# Patient Record
Sex: Male | Born: 1995 | Race: Black or African American | Hispanic: No | Marital: Single | Smoking: Current every day smoker
Health system: Southern US, Community
[De-identification: ages and names within clinical notes are randomized; demographics above are authoritative.]

---
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
## 2014-03-20 ENCOUNTER — Encounter (HOSPITAL_COMMUNITY): Payer: Self-pay | Admitting: Emergency Medicine

## 2014-03-20 ENCOUNTER — Emergency Department (HOSPITAL_COMMUNITY)
Admission: EM | Admit: 2014-03-20 | Discharge: 2014-03-20 | Disposition: A | Discharge status: Home / Self Care | Payer: BC Managed Care – PPO | Attending: Emergency Medicine | Admitting: Emergency Medicine

## 2014-03-20 DIAGNOSIS — Z791 Long term (current) use of non-steroidal anti-inflammatories (NSAID): Secondary | ICD-10-CM | POA: Diagnosis not present

## 2014-03-20 DIAGNOSIS — Z711 Person with feared health complaint in whom no diagnosis is made: Secondary | ICD-10-CM

## 2014-03-20 DIAGNOSIS — R300 Dysuria: Secondary | ICD-10-CM | POA: Diagnosis present

## 2014-03-20 DIAGNOSIS — R369 Urethral discharge, unspecified: Secondary | ICD-10-CM | POA: Diagnosis not present

## 2014-03-20 DIAGNOSIS — Z792 Long term (current) use of antibiotics: Secondary | ICD-10-CM | POA: Insufficient documentation

## 2014-03-20 DIAGNOSIS — Z202 Contact with and (suspected) exposure to infections with a predominantly sexual mode of transmission: Secondary | ICD-10-CM | POA: Insufficient documentation

## 2014-03-20 DIAGNOSIS — Z720 Tobacco use: Secondary | ICD-10-CM | POA: Insufficient documentation

## 2014-03-20 LAB — URINALYSIS, ROUTINE W REFLEX MICROSCOPIC
Bilirubin Urine: NEGATIVE
GLUCOSE, UA: NEGATIVE mg/dL
Hgb urine dipstick: NEGATIVE
Ketones, ur: 15 mg/dL — AB
NITRITE: NEGATIVE
PROTEIN: 30 mg/dL — AB
Specific Gravity, Urine: 1.031 — ABNORMAL HIGH (ref 1.005–1.030)
Urobilinogen, UA: 2 mg/dL — ABNORMAL HIGH (ref 0.0–1.0)
pH: 7.5 (ref 5.0–8.0)

## 2014-03-20 LAB — URINE MICROSCOPIC-ADD ON

## 2014-03-20 LAB — HIV ANTIBODY (ROUTINE TESTING W REFLEX): HIV: NONREACTIVE

## 2014-03-20 LAB — RPR

## 2014-03-20 MED ORDER — LIDOCAINE HCL (PF) 1 % IJ SOLN
5.0000 mL | Freq: Once | INTRAMUSCULAR | Status: AC
  Administered 2014-03-20: 2.1 mL
  Filled 2014-03-20: qty 5

## 2014-03-20 MED ORDER — AZITHROMYCIN 250 MG PO TABS
1000.0000 mg | ORAL_TABLET | Freq: Once | ORAL | Status: AC
  Administered 2014-03-20: 1000 mg via ORAL
  Filled 2014-03-20: qty 4

## 2014-03-20 MED ORDER — METRONIDAZOLE 500 MG PO TABS
2000.0000 mg | ORAL_TABLET | Freq: Once | ORAL | Status: AC
  Administered 2014-03-20: 2000 mg via ORAL
  Filled 2014-03-20: qty 4

## 2014-03-20 MED ORDER — CEFTRIAXONE SODIUM 250 MG IJ SOLR
250.0000 mg | Freq: Once | INTRAMUSCULAR | Status: AC
  Administered 2014-03-20: 250 mg via INTRAMUSCULAR
  Filled 2014-03-20: qty 250

## 2014-03-20 NOTE — ED Provider Notes (Signed)
CSN: [id number]     Arrival date & time 03/20/14  1614 History  This chart was scribed for non-physician practitioner, Allen Derry, PA-C working with Warnell Forester, MD by Luisa Dago, ED scribe. This patient was seen in room TR10C/TR10C and the patient's care was started at 5:08 PM.    Chief Complaint  Patient presents with  . Penile Discharge  . Dysuria   Patient is a 18 y.o. male presenting with penile discharge and dysuria. The history is provided by the patient. No language interpreter was used.  Penile Discharge This is a new problem. The current episode started more than 2 days ago. The problem occurs constantly. The problem has been gradually worsening. Pertinent negatives include no chest pain, no abdominal pain and no shortness of breath. Exacerbated by: erection and urination. Nothing relieves the symptoms. He has tried nothing for the symptoms.  Dysuria Pertinent negatives include no chest pain, no abdominal pain and no shortness of breath.   HPI Comments: Donald Hamilton is a 18 y.o. Healthy male who presents to the Emergency Department complaining of thick whiteish penile discharge that started 1 week ago. Pt is also complaining of associated dysuria. He describes the discomfort as a burning sensation in his penis, and states it's only present when urinating. He rates his current pain as a "8/10" and exacerbated by erections and urination. No known alleviating factors aside from not urinating. Has not ejaculated since the pain started therefore he's unsure if this exacerbates his pain. In the last year pt has had 4 sexual partners, protected with all except for one last Saturday, vaginal penetration only. He does endorse a hx of chlamydia. Denies any CP, SOB, visual disturbances, eye redness/pain, hematuria, fever, chills, erythema of his penis, testicular or scrotal pain/swelling, rectal pain, genital lesions, nausea, emesis, constipation, abdominal pain, arthralgias, myalgias,  or rash.   History reviewed. No pertinent past medical history. History reviewed. No pertinent past surgical history. History reviewed. No pertinent family history. History  Substance Use Topics  . Smoking status: Current Every Day Smoker  . Smokeless tobacco: Not on file  . Alcohol Use: No    Review of Systems  Constitutional: Negative for fever and chills.  Eyes: Negative for pain and redness.  Respiratory: Negative for shortness of breath.   Cardiovascular: Negative for chest pain.  Gastrointestinal: Negative for nausea, vomiting, abdominal pain, diarrhea, constipation and rectal pain.  Genitourinary: Positive for dysuria and discharge. Negative for urgency, frequency, hematuria, flank pain, penile swelling, scrotal swelling, difficulty urinating, genital sores, penile pain and testicular pain.  Musculoskeletal: Negative for myalgias and arthralgias.  Skin: Negative for rash.  Allergic/Immunologic: Negative for immunocompromised state.  Neurological: Negative for weakness.  10 Systems reviewed and all are negative for acute change except as noted in the HPI.      Allergies  Review of patient's allergies indicates no known allergies.  Home Medications   Prior to Admission medications   Medication Sig Start Date End Date Taking? Authorizing Provider  doxycycline (VIBRAMYCIN) 100 MG capsule Take 1 capsule (100 mg total) by mouth 2 (two) times daily. One po bid x 7 days 11/09/13   Fayrene Helper, PA-C  ibuprofen (ADVIL,MOTRIN) 800 MG tablet Take 1 tablet (800 mg total) by mouth 3 (three) times daily. 11/09/13   Fayrene Helper, PA-C   BP 128/69 mmHg  Pulse 111  Temp(Src) 98.5 F (36.9 C) (Oral)  Resp 18  Ht 6\' 2"  (1.88 m)  Wt 180 lb (81.647 kg)  BMI  23.10 kg/m2  SpO2 100%  Physical Exam  Constitutional: He is oriented to person, place, and time. Vital signs are normal. He appears well-developed and well-nourished.  Non-toxic appearance. No distress.  Afebrile, nontoxic  HENT:   Head: Normocephalic and atraumatic.  Eyes: Conjunctivae and EOM are normal. Right eye exhibits no discharge. Left eye exhibits no discharge.  Neck: Normal range of motion. Neck supple.  Cardiovascular: Normal rate.   Pulmonary/Chest: Effort normal. No respiratory distress.  Abdominal: Soft. Normal appearance. He exhibits no distension. There is no tenderness. There is no rigidity, no rebound, no guarding and no CVA tenderness. Hernia confirmed negative in the right inguinal area and confirmed negative in the left inguinal area.  Soft, NTND, no r/g/r, no CVA TTP   Genitourinary: Testes normal. Cremasteric reflex is present. Right testis shows no mass, no swelling and no tenderness. Left testis shows no mass, no swelling and no tenderness. Circumcised. No phimosis, paraphimosis, hypospadias, penile erythema or penile tenderness. Discharge found.  Circumcised penis without phimosis/paraphimosis, hypospadias, erythema, or tenderness. +White creamy discharge. Testes with no masses or tenderness, no swelling, and cremasterics reflex present bilaterally. No inguinal hernias or adenopathy present.  Musculoskeletal: Normal range of motion.  Lymphadenopathy:       Right: No inguinal adenopathy present.       Left: No inguinal adenopathy present.  Neurological: He is alert and oriented to person, place, and time. He has normal strength. No sensory deficit. Gait normal.  Skin: Skin is warm, dry and intact. No rash noted.  Psychiatric: He has a normal mood and affect. His behavior is normal.  Nursing note and vitals reviewed.   ED Course  Procedures (including critical care time)  DIAGNOSTIC STUDIES: Oxygen Saturation is 100% on RA, normal by my interpretation.    COORDINATION OF CARE: 5:14 PM- Will order an STD panel. Pt advised of plan for treatment and pt agrees.  Labs Review Labs Reviewed  URINALYSIS, ROUTINE W REFLEX MICROSCOPIC - Abnormal; Notable for the following:    Color, Urine AMBER  (*)    APPearance TURBID (*)    Specific Gravity, Urine 1.031 (*)    Ketones, ur 15 (*)    Protein, ur 30 (*)    Urobilinogen, UA 2.0 (*)    Leukocytes, UA LARGE (*)    All other components within normal limits  GC/CHLAMYDIA PROBE AMP  URINE MICROSCOPIC-ADD ON  RPR  HIV ANTIBODY (ROUTINE TESTING)    Imaging Review No results found.   EKG Interpretation None      MDM   Final diagnoses:  Penile discharge  Dysuria  Concern about STD in male without diagnosis    18 y.o. male with dysuria and penile discharge. U/A consistent with urethritis, and discharge c/w GC/CT. Treated empirically today. HIV/RPR checked. Discussed f/up with [facility] for future STD concerns. Discussed that lab will call him if + tests. Dental pain associated with dental infection and possible dental abscess with patient afebrile, non toxic appearing and swallowing secretions well. I gave patient referral to dentist and stressed the importance of dental follow up for ultimate management of dental pain.  I have also discussed reasons to return immediately to the ER.  Patient expresses understanding and agrees with plan.  I will also give doxycycline and pain control.    I personally performed the services described in this documentation, which was scribed in my presence. The recorded information has been reviewed and is accurate.  BP 128/69 mmHg  Pulse  111  Temp(Src) 98.5 F (36.9 C) (Oral)  Resp 18  Ht 6\' 2"  (1.88 m)  Wt 180 lb (81.647 kg)  BMI 23.10 kg/m2  SpO2 100%  Meds ordered this encounter  Medications  . azithromycin (ZITHROMAX) tablet 1,000 mg    Sig:    And  . cefTRIAXone (ROCEPHIN) injection 250 mg    Sig:     Order Specific Question:  Antibiotic Indication:    Answer:  STD   And  . metroNIDAZOLE (FLAGYL) tablet 2,000 mg    Sig:   . lidocaine (PF) (XYLOCAINE) 1 % injection 5 mL    Sig:      Donnita FallsMercedes Strupp Louisvilleamprubi-Soms, PA-C 03/20/14 1815  Warnell Foresterrey Wofford,  MD 03/24/14 1810

## 2014-03-20 NOTE — ED Notes (Signed)
Pt c/o dysuria and penile discharge; pt sts thinks he has an STD

## 2014-03-20 NOTE — Discharge Instructions (Signed)
[facility] or screenings. This is the recommendation by the CDC for people with multiple sexual partners or hx of STDs. You have been treated for gonorrhea and chlamydia in the ER but the hospital will call you if lab is positive. You were tested for HIV and Syphilis, and the hospital will call you if the lab is positive.    Dysuria Dysuria is the medical term for pain with urination. There are many causes for dysuria, but urinary tract infection is the most common. If a urinalysis was performed it can show that there is a urinary tract infection. A urine culture confirms that you or your child is sick. You will need to follow up with a healthcare provider because:  If a urine culture was done you will need to know the culture results and treatment recommendations.  If the urine culture was positive, you or your child will need to be put on antibiotics or know if the antibiotics prescribed are the right antibiotics for your urinary tract infection.  If the urine culture is negative (no urinary tract infection), then other causes may need to be explored or antibiotics need to be stopped. Today laboratory work may have been done and there does not seem to be an infection. If cultures were done they will take at least 24 to 48 hours to be completed. Today x-rays may have been taken and they read as normal. No cause can be found for the problems. The x-rays may be re-read by a radiologist and you will be contacted if additional findings are made. You or your child may have been put on medications to help with this problem until you can see your primary caregiver. If the problems get better, see your primary caregiver if the problems return. If you were given antibiotics (medications which kill germs), take all of the mediations as directed for the full course of treatment.  If laboratory work was done, you need to find the results.  Leave a telephone number where you can be reached. If this is not possible, make sure you find out how you are to get test results. HOME CARE INSTRUCTIONS   Drink lots of fluids. For adults, drink eight, 8 ounce glasses of clear juice or water a day. For children, replace fluids as suggested by your caregiver.  Empty the bladder often. Avoid holding urine for long periods of time.  After a bowel movement, women should cleanse front to back, using each tissue only once.  Empty your bladder before and after sexual intercourse.  Take all the medicine given to you until it is gone. You may feel better in a few days, but TAKE ALL MEDICINE.  Avoid caffeine, tea, alcohol and carbonated beverages, because they tend to irritate the bladder.  In men, alcohol may irritate the prostate.  Only take over-the-counter or prescription medicines for pain, discomfort, or fever as directed by your caregiver.  If your caregiver has given you a follow-up appointment, it is very important to keep that appointment. Not keeping the appointment could result in a chronic or permanent injury, pain, and disability. If there is any problem keeping the appointment, you must call back to this facility for assistance. SEEK IMMEDIATE MEDICAL CARE IF:   Back pain develops.  A fever develops.  There is nausea (feeling sick to your stomach) or vomiting (throwing up).  Problems are no better with medications or are getting worse. MAKE SURE  Understand these instructions. °· Will watch your condition. °· Will get help right away if you are not doing well or get worse. °Document Released: 12/27/2003 Document Revised: 06/22/2011 Document Reviewed: 11/03/2007 °ExitCare® Patient Information ©2015 [employer]. This information is not intended to replace advice given to you by your health care provider. Make sure you discuss any questions you have with your health care provider. ° °

## 2014-03-21 LAB — GC/CHLAMYDIA PROBE AMP
CT PROBE, AMP APTIMA: NEGATIVE
GC Probe RNA: POSITIVE — AB

## 2014-03-22 ENCOUNTER — Telehealth (HOSPITAL_BASED_OUTPATIENT_CLINIC_OR_DEPARTMENT_OTHER): Payer: Self-pay | Admitting: Emergency Medicine

## 2014-03-23 ENCOUNTER — Telehealth: Payer: Self-pay | Admitting: Emergency Medicine

## 2015-04-16 ENCOUNTER — Emergency Department (HOSPITAL_COMMUNITY)
Admission: EM | Admit: 2015-04-16 | Discharge: 2015-04-16 | Discharge status: Against Medical Advice | Payer: BLUE CROSS/BLUE SHIELD | Attending: Emergency Medicine | Admitting: Emergency Medicine

## 2015-04-16 ENCOUNTER — Emergency Department (HOSPITAL_COMMUNITY): Payer: BLUE CROSS/BLUE SHIELD

## 2015-04-16 DIAGNOSIS — S199XXA Unspecified injury of neck, initial encounter: Secondary | ICD-10-CM | POA: Insufficient documentation

## 2015-04-16 DIAGNOSIS — Y92410 Unspecified street and highway as the place of occurrence of the external cause: Secondary | ICD-10-CM | POA: Insufficient documentation

## 2015-04-16 DIAGNOSIS — Y9389 Activity, other specified: Secondary | ICD-10-CM | POA: Insufficient documentation

## 2015-04-16 DIAGNOSIS — Y998 Other external cause status: Secondary | ICD-10-CM | POA: Insufficient documentation

## 2015-04-16 DIAGNOSIS — F17200 Nicotine dependence, unspecified, uncomplicated: Secondary | ICD-10-CM | POA: Insufficient documentation

## 2015-04-16 DIAGNOSIS — Z791 Long term (current) use of non-steroidal anti-inflammatories (NSAID): Secondary | ICD-10-CM | POA: Insufficient documentation

## 2015-04-16 DIAGNOSIS — S29001A Unspecified injury of muscle and tendon of front wall of thorax, initial encounter: Secondary | ICD-10-CM | POA: Insufficient documentation

## 2015-04-16 NOTE — ED Notes (Signed)
Pt to xray
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
## 2015-04-16 NOTE — ED Notes (Signed)
Bed: WA27 Expected date:  Expected time:  Means of arrival:  Comments: EMS 
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
## 2015-08-08 ENCOUNTER — Emergency Department (HOSPITAL_COMMUNITY)
Admission: EM | Admit: 2015-08-08 | Discharge: 2015-08-08 | Disposition: A | Discharge status: Home / Self Care | Payer: BLUE CROSS/BLUE SHIELD | Attending: Emergency Medicine | Admitting: Emergency Medicine

## 2015-08-08 ENCOUNTER — Encounter (HOSPITAL_COMMUNITY): Payer: Self-pay | Admitting: *Deleted

## 2015-08-08 DIAGNOSIS — Z202 Contact with and (suspected) exposure to infections with a predominantly sexual mode of transmission: Secondary | ICD-10-CM | POA: Diagnosis present

## 2015-08-08 DIAGNOSIS — Z791 Long term (current) use of non-steroidal anti-inflammatories (NSAID): Secondary | ICD-10-CM | POA: Insufficient documentation

## 2015-08-08 DIAGNOSIS — R300 Dysuria: Secondary | ICD-10-CM | POA: Diagnosis not present

## 2015-08-08 DIAGNOSIS — F17200 Nicotine dependence, unspecified, uncomplicated: Secondary | ICD-10-CM | POA: Diagnosis not present

## 2015-08-08 DIAGNOSIS — R369 Urethral discharge, unspecified: Secondary | ICD-10-CM | POA: Insufficient documentation

## 2015-08-08 MED ORDER — AZITHROMYCIN 250 MG PO TABS
1000.0000 mg | ORAL_TABLET | Freq: Once | ORAL | Status: AC
  Administered 2015-08-08: 1000 mg via ORAL
  Filled 2015-08-08: qty 4

## 2015-08-08 MED ORDER — ONDANSETRON 4 MG PO TBDP
4.0000 mg | ORAL_TABLET | Freq: Once | ORAL | Status: AC
  Administered 2015-08-08: 4 mg via ORAL
  Filled 2015-08-08: qty 1

## 2015-08-08 MED ORDER — CEFTRIAXONE SODIUM 250 MG IJ SOLR
250.0000 mg | Freq: Once | INTRAMUSCULAR | Status: AC
  Administered 2015-08-08: 250 mg via INTRAMUSCULAR
  Filled 2015-08-08: qty 250

## 2015-08-08 MED ORDER — METRONIDAZOLE 500 MG PO TABS
2000.0000 mg | ORAL_TABLET | Freq: Once | ORAL | Status: AC
  Administered 2015-08-08: 2000 mg via ORAL
  Filled 2015-08-08: qty 4

## 2015-08-08 MED ORDER — STERILE WATER FOR INJECTION IJ SOLN
10.0000 mL | Freq: Once | INTRAMUSCULAR | Status: AC
  Administered 2015-08-08: 10 mL via INTRAMUSCULAR
  Filled 2015-08-08: qty 10

## 2015-08-08 NOTE — ED Notes (Signed)
Pt reports burning with urination states that he know that he has been exposed to chlamydia.

## 2015-08-08 NOTE — Discharge Instructions (Signed)
Safe Sex °Safe sex is about reducing the risk of giving or getting a sexually transmitted disease (STD). STDs are spread through sexual contact involving the genitals, mouth, or rectum. Some STDs can be cured and others cannot. Safe sex can also prevent unintended pregnancies.  °WHAT ARE SOME SAFE SEX PRACTICES? °· Limit your sexual activity to only one partner who is having sex with only you. °· Talk to your partner about his or her past partners, past STDs, and drug use. °· Use a condom every time you have sexual intercourse. This includes vaginal, oral, and anal sexual activity. Both females and males should wear condoms during oral sex. Only use latex or polyurethane condoms and water-based lubricants. Using petroleum-based lubricants or oils to lubricate a condom will weaken the condom and increase the chance that it will break. The condom should be in place from the beginning to the end of sexual activity. Wearing a condom reduces, but does not completely eliminate, your risk of getting or giving an STD. STDs can be spread by contact with infected body fluids and skin. °· Get vaccinated for hepatitis B and HPV. °· Avoid alcohol and recreational drugs, which can affect your judgment. You may forget to use a condom or participate in high-risk sex. °· For females, avoid douching after sexual intercourse. Douching can spread an infection farther into the reproductive tract. °· Check your body for signs of sores, blisters, rashes, or unusual discharge. See your health care provider if you notice any of these signs. °· Avoid sexual contact if you have symptoms of an infection or are being treated for an STD. If you or your partner has herpes, avoid sexual contact when blisters are present. Use condoms at all other times. °· If you are at risk of being infected with HIV, it is recommended that you take a prescription medicine daily to prevent HIV infection. This is called pre-exposure prophylaxis (PrEP). You are  considered at risk if: °· You are a man who has sex with other men (MSM). °· You are a heterosexual man or woman who is sexually active with more than one partner. °· You take drugs by injection. °· You are sexually active with a partner who has HIV. °· Talk with your health care provider about whether you are at high risk of being infected with HIV. If you choose to begin PrEP, you should first be tested for HIV. You should then be tested every 3 months for as long as you are taking PrEP. °· See your health care provider for regular screenings, exams, and tests for other STDs. Before having sex with a new partner, each of you should be screened for STDs and should talk about the results with each other. °WHAT ARE THE BENEFITS OF SAFE SEX?  °· There is less chance of getting or giving an STD. °· You can prevent unwanted or unintended pregnancies. °· By discussing safe sex concerns with your partner, you may increase feelings of intimacy, comfort, trust, and honesty between the two of you. °  °This information is not intended to replace advice given to you by your health care provider. Make sure you discuss any questions you have with your health care provider. °  °Document Released: 05/07/2004 Document Revised: 04/20/2014 Document Reviewed: 09/21/2011 °Elsevier Interactive Patient Education ©2016 Elsevier Inc. ° °Sexually Transmitted Disease °A sexually transmitted disease (STD) is a disease or infection that may be passed (transmitted) from person to person, usually during sexual activity. This may happen by   way of saliva, semen, blood, vaginal mucus, or urine. Common STDs include: °· Gonorrhea. °· Chlamydia. °· Syphilis. °· HIV and AIDS. °· Genital herpes. °· Hepatitis B and C. °· Trichomonas. °· Human papillomavirus (HPV). °· Pubic lice. °· Scabies. °· Mites. °· Bacterial vaginosis. °WHAT ARE CAUSES OF STDs? °An STD may be caused by bacteria, a virus, or parasites. STDs are often transmitted during sexual  activity if one person is infected. However, they may also be transmitted through nonsexual means. STDs may be transmitted after:  °· Sexual intercourse with an infected person. °· Sharing sex toys with an infected person. °· Sharing needles with an infected person or using unclean piercing or tattoo needles. °· Having intimate contact with the genitals, mouth, or rectal areas of an infected person. °· Exposure to infected fluids during birth. °WHAT ARE THE SIGNS AND SYMPTOMS OF STDs? °Different STDs have different symptoms. Some people may not have any symptoms. If symptoms are present, they may include: °· Painful or bloody urination. °· Pain in the pelvis, abdomen, vagina, anus, throat, or eyes. °· A skin rash, itching, or irritation. °· Growths, ulcerations, blisters, or sores in the genital and anal areas. °· Abnormal vaginal discharge with or without bad odor. °· Penile discharge in men. °· Fever. °· Pain or bleeding during sexual intercourse. °· Swollen glands in the groin area. °· Yellow skin and eyes (jaundice). This is seen with hepatitis. °· Swollen testicles. °· Infertility. °· Sores and blisters in the mouth. °HOW ARE STDs DIAGNOSED? °To make a diagnosis, your health care provider may: °· Take a medical history. °· Perform a physical exam. °· Take a sample of any discharge to examine. °· Swab the throat, cervix, opening to the penis, rectum, or vagina for testing. °· Test a sample of your first morning urine. °· Perform blood tests. °· Perform a Pap test, if this applies. °· Perform a colposcopy. °· Perform a laparoscopy. °HOW ARE STDs TREATED? °Treatment depends on the STD. Some STDs may be treated but not cured. °· Chlamydia, gonorrhea, trichomonas, and syphilis can be cured with antibiotic medicine. °· Genital herpes, hepatitis, and HIV can be treated, but not cured, with prescribed medicines. The medicines lessen symptoms. °· Genital warts from HPV can be treated with medicine or by freezing,  burning (electrocautery), or surgery. Warts may come back. °· HPV cannot be cured with medicine or surgery. However, abnormal areas may be removed from the cervix, vagina, or vulva. °· If your diagnosis is confirmed, your recent sexual partners need treatment. This is true even if they are symptom-free or have a negative culture or evaluation. They should not have sex until their health care providers say it is okay. °· Your health care provider may test you for infection again 3 months after treatment. °HOW CAN I REDUCE MY RISK OF GETTING AN STD? °Take these steps to reduce your risk of getting an STD: °· Use latex condoms, dental dams, and water-soluble lubricants during sexual activity. Do not use petroleum jelly or oils. °· Avoid having multiple sex partners. °· Do not have sex with someone who has other sex partners °· Do not have sex with anyone you do not know or who is at high risk for an STD. °· Avoid risky sex practices that can break your skin. °· Do not have sex if you have open sores on your mouth or skin. °· Avoid drinking too much alcohol or taking illegal drugs. Alcohol and drugs can affect your judgment and put   you in a vulnerable position.  Avoid engaging in oral and anal sex acts.  Get vaccinated for HPV and hepatitis. If you have not received these vaccines in the past, talk to your health care provider about whether one or both might be right for you.  If you are at risk of being infected with HIV, it is recommended that you take a prescription medicine daily to prevent HIV infection. This is called pre-exposure prophylaxis (PrEP). You are considered at risk if:  You are a man who has sex with other men (MSM).  You are a heterosexual man or woman and are sexually active with more than one partner.  You take drugs by injection.  You are sexually active with a partner who has HIV.  Talk with your health care provider about whether you are at high risk of being infected with HIV. If  you choose to begin PrEP, you should first be tested for HIV. You should then be tested every 3 months for as long as you are taking PrEP. WHAT SHOULD I DO IF I THINK I HAVE AN STD?  See your health care provider.  Tell your sexual partner(s). They should be tested and treated for any STDs.  Do not have sex until your health care provider says it is okay. WHEN SHOULD I GET IMMEDIATE MEDICAL CARE? Contact your health care provider right away if:   You have severe abdominal pain.  You are a man and notice swelling or pain in your testicles.  You are a woman and notice swelling or pain in your vagina.   This information is not intended to replace advice given to you by your health care provider. Make sure you discuss any questions you have with your health care provider.   Document Released: 06/20/2002 Document Revised: 04/20/2014 Document Reviewed: 10/18/2012 Elsevier Interactive Patient Education 2016 Elsevier Inc.  Free HIV and STD Testing These locations offer FREE confidential testing for HIV, Chlamydia, Gonorrhea, and Syphilis. Non-Traditional Testing Sites Address Telephone  [facility] [address], [place] [phone] Mondays 5pm - 7pm  [facility] [address], [address] [place] [phone] Wednesdays 2pm-8pm  [facility] [address], [place] [phone] Thursdays 9am-12noon 1pm-4pm  [facility] [address], [place] [phone] Tuesdays Thursdays 9am-12noon 1pm-4pm  [facility] offers free, confidential testing and treatment for HIV, Chlamydia, Gonorrhea, Syphilis, Herpes, Bacterial Vaginosis, Yeast, and Trichomoniasis. Traditional Testing   [facility] [address], [place] [phone]    Monday thru Friday  Call for an appointment  [facility] [address] [place] [phone] Monday thru Friday  Call for anappointment.  If you have any questions about this information please call [phone]. 02/19/2011

## 2015-08-08 NOTE — ED Provider Notes (Signed)
CSN: [id number]     Arrival date & time 08/08/15  1245 History   By signing my name below, I, Donald Hamilton, attest that this documentation has been prepared under the direction and in the presence of Arthor CaptainAbigail Maripat Borba, PA-C Electronically Signed: Charline BillsEssence Hamilton, ED Scribe 08/08/2015 at 2:04 PM.   Chief Complaint  Patient presents with  . Exposure to STD   The history is provided by the patient. No language interpreter was used.   HPI Comments: Donald Hamilton is a 20 y.o. male who presents to the Emergency Department complaining of exposure to STD. Pt reports sexual intercourse with a male partner who tested positive for chlamydia. He reports associated dysuria for the past few days. Pt denies any other symptoms.   History reviewed. No pertinent past medical history. History reviewed. No pertinent past surgical history. No family history on file. Social History  Substance Use Topics  . Smoking status: Current Every Day Smoker  . Smokeless tobacco: None  . Alcohol Use: No    Review of Systems  Constitutional: Negative for fever.  Genitourinary: Positive for dysuria.   Allergies  Review of patient's allergies indicates no known allergies.  Home Medications   Prior to Admission medications   Medication Sig Start Date End Date Taking? Authorizing Provider  doxycycline (VIBRAMYCIN) 100 MG capsule Take 1 capsule (100 mg total) by mouth 2 (two) times daily. One po bid x 7 days 11/09/13   Fayrene HelperBowie Tran, PA-C  ibuprofen (ADVIL,MOTRIN) 800 MG tablet Take 1 tablet (800 mg total) by mouth 3 (three) times daily. 11/09/13   Fayrene HelperBowie Tran, PA-C   BP 139/62 mmHg  Pulse 72  Temp(Src) 98.3 F (36.8 C) (Oral)  Resp 14  Ht 6\' 2"  (1.88 m)  Wt 180 lb (81.647 kg)  BMI 23.10 kg/m2  SpO2 100% Physical Exam  Constitutional: He is oriented to person, place, and time. He appears well-developed and well-nourished. No distress.  HENT:  Head: Normocephalic and atraumatic.  Eyes: Conjunctivae and EOM are  normal.  Neck: Neck supple. No tracheal deviation present.  Cardiovascular: Normal rate.   Pulmonary/Chest: Effort normal. No respiratory distress.  Genitourinary: Circumcised. Discharge (clear; from the meatus) found.  Chaperone present. Normal anatomy.   Musculoskeletal: Normal range of motion.  Neurological: He is alert and oriented to person, place, and time.  Skin: Skin is warm and dry.  Psychiatric: He has a normal mood and affect. His behavior is normal.  Nursing note and vitals reviewed.  ED Course  Procedures (including critical care time) DIAGNOSTIC STUDIES: Oxygen Saturation is 100% on RA, normal by my interpretation.    COORDINATION OF CARE: 2:07 PM-Discussed treatment plan which includes STD screening, Zofran, Rocephin, Zithromax, Flagyl with pt at bedside and pt agreed to plan.   Labs Review Labs Reviewed  RPR  HIV ANTIBODY (ROUTINE TESTING)   Imaging Review No results found. I have personally reviewed and evaluated these images and lab results as part of my medical decision-making.   EKG Interpretation None      MDM   Final diagnoses:  Exposure to chlamydia    Patient treated in the ED for known exposure to chlamydia with clear penile discharge.  No signs of epididymitis.  HIV/RPR sent. Appears safe for discharge  I personally performed the services described in this documentation, which was scribed in my presence. The recorded information has been reviewed and is accurate.        Arthor Captainbigail Thea Holshouser, PA-C 08/08/15 1433  Marily MemosJason Mesner, MD 08/08/15  1651 

## 2015-08-09 LAB — RPR: RPR Ser Ql: NONREACTIVE

## 2015-08-09 LAB — HIV ANTIBODY (ROUTINE TESTING W REFLEX): HIV Screen 4th Generation wRfx: NONREACTIVE

## 2015-12-04 ENCOUNTER — Emergency Department (HOSPITAL_COMMUNITY): Payer: BLUE CROSS/BLUE SHIELD

## 2015-12-04 ENCOUNTER — Encounter (HOSPITAL_COMMUNITY): Payer: Self-pay | Admitting: Neurology

## 2015-12-04 ENCOUNTER — Emergency Department (HOSPITAL_COMMUNITY)
Admission: EM | Admit: 2015-12-04 | Discharge: 2015-12-04 | Disposition: A | Discharge status: Home / Self Care | Payer: BLUE CROSS/BLUE SHIELD | Attending: Emergency Medicine | Admitting: Emergency Medicine

## 2015-12-04 DIAGNOSIS — F17200 Nicotine dependence, unspecified, uncomplicated: Secondary | ICD-10-CM | POA: Diagnosis not present

## 2015-12-04 DIAGNOSIS — M25512 Pain in left shoulder: Secondary | ICD-10-CM | POA: Insufficient documentation

## 2015-12-04 MED ORDER — CYCLOBENZAPRINE HCL 5 MG PO TABS: 5.0000 mg | ORAL_TABLET | Freq: Three times a day (TID) | ORAL | 0 refills | Status: AC | PRN

## 2015-12-04 MED ORDER — IBUPROFEN 400 MG PO TABS: 400.0000 mg | ORAL_TABLET | Freq: Once | ORAL | Status: DC

## 2015-12-04 MED ORDER — CYCLOBENZAPRINE HCL 10 MG PO TABS: 5.0000 mg | ORAL_TABLET | Freq: Once | ORAL | Status: DC

## 2015-12-04 NOTE — ED Triage Notes (Signed)
Pt here c/o left shoulder pain after wrestling with his brother last night. Pt able to extend shoulder, no deformity noted.

## 2015-12-04 NOTE — Discharge Instructions (Signed)
Read the information below.   Your x-rays did not show any acute dislocation or fracture. You were given an arm sling, wear during the day for the next 48 hours. Apply ice for 20 minute increments for the next 24-48hours. Take motrin 400mg  every 6hrs of tylenol 650mg  every 6hrs for pain control. I have prescribed flexeril for muscle pain. This can make you drowsy, do not drive after taking.  Use the prescribed medication as directed.  Please discuss all new medications with your pharmacist.   If symptoms persist follow up with your primary care doctor in 5-7 days.  You may return to the Emergency Department at any time for worsening condition or any new symptoms that concern you. Return to ED if you develop fever, red/hot/swollen joint that you are unable to move, or develop numbness/weakness in your left arm.

## 2015-12-04 NOTE — ED Provider Notes (Signed)
[facility]EM[facility] DEPT Provider Note   CSN: [id number] Arrival date & time: 12/04/15  1041  By signing my name below, I, Placido SouLogan Joldersma, attest that this documentation has been prepared under the direction and in the presence of Arvilla MeresAshley Sagan Maselli, PA-C.  Electronically Signed: Placido SouLogan Joldersma, ED Scribe. 12/04/15. 12:28 PM.   History   Chief Complaint Chief Complaint  Patient presents with  . Shoulder Pain    HPI HPI Comments: Donald Hamilton is a 20 y.o. male who is otherwise healthy presents to the Emergency Department complaining of sudden onset, moderate, diffuse, left shoulder pain onset last night. He states that he was wrestling with his brother and felt a "pop" in the shoulder and a sudden onset of his pain which has mildly worsened. His pain is a 5/10 at rest and worsens to 10/10 with LUE movement. He denies having taken any medications for his symptoms. Pt reports mild left shoulder swelling. He denies any other known medical conditions or any recent surgeries. He denies falls, fever, chills, redness/warmth of joint, weakness, numbness and neck pain.   The history is provided by the patient. No language interpreter was used.    History reviewed. No pertinent past medical history.  There are no active problems to display for this patient.   History reviewed. No pertinent surgical history.   Home Medications    Prior to Admission medications   Medication Sig Start Date End Date Taking? Authorizing Provider  cyclobenzaprine (FLEXERIL) 5 MG tablet Take 1 tablet (5 mg total) by mouth 3 (three) times daily as needed for muscle spasms. 12/04/15   Lona KettleAshley Laurel Tela Kotecki, PA-C  doxycycline (VIBRAMYCIN) 100 MG capsule Take 1 capsule (100 mg total) by mouth 2 (two) times daily. One po bid x 7 days 11/09/13   Fayrene HelperBowie Tran, PA-C  ibuprofen (ADVIL,MOTRIN) 800 MG tablet Take 1 tablet (800 mg total) by mouth 3 (three) times daily. 11/09/13   Fayrene HelperBowie Tran, PA-C    Family History No family history on  file.  Social History Social History  Substance Use Topics  . Smoking status: Current Every Day Smoker  . Smokeless tobacco: Not on file  . Alcohol use No     Allergies   Review of patient's allergies indicates no known allergies.   Review of Systems Review of Systems  Constitutional: Negative for chills and fever.  Musculoskeletal: Positive for arthralgias and joint swelling. Negative for neck pain.  Skin: Negative for color change and wound.  Neurological: Negative for weakness and numbness.   Physical Exam Updated Vital Signs BP 122/59 (BP Location: Right Arm)   Pulse 60   Temp 98.4 F (36.9 C) (Oral)   Resp 16   SpO2 100%   Physical Exam  Constitutional: He is oriented to person, place, and time. He appears well-developed and well-nourished.  HENT:  Head: Normocephalic and atraumatic.  Eyes: EOM are normal.  Neck: Normal range of motion and phonation normal. No spinous process tenderness and no muscular tenderness present. No neck rigidity. Normal range of motion present.  Cardiovascular: Normal rate.   Pulmonary/Chest: Effort normal. No stridor. No respiratory distress.  Abdominal: Soft.  Musculoskeletal: Normal range of motion. He exhibits tenderness.  Pain with ROM of the LUE. Tenderness noted to the [facility] of the left shoulder. No swelling, bruising, warmth, redness or obvious deformities. 2+ radial pulses.   Neurological: He is alert and oriented to person, place, and time.  Distal sensation intact in the LUE. 5/5 strength of the LUE.  Skin: Skin is warm and dry.  Psychiatric: He has a normal mood and affect. His behavior is normal.  Nursing note and vitals reviewed.  ED Treatments / Results  Labs (all labs ordered are listed, but only abnormal results are displayed) Labs Reviewed - No data to display  EKG  EKG Interpretation None       Radiology Dg Shoulder Left  Result Date: 12/04/2015 CLINICAL DATA:  Left shoulder pain. EXAM: LEFT SHOULDER  - 2+ VIEW COMPARISON:  None. FINDINGS: There is no evidence of fracture or dislocation. There is no evidence of arthropathy or other focal bone abnormality. Soft tissues are unremarkable. IMPRESSION: Negative. Electronically Signed   By: Irish LackGlenn  Yamagata M.D.   On: 12/04/2015 12:08    Procedures Procedures  DIAGNOSTIC STUDIES: Oxygen Saturation is 100% on RA, normal by my interpretation.    COORDINATION OF CARE: 12:25 PM Discussed next steps with pt. Pt verbalized understanding and is agreeable with the plan.    Medications Ordered in ED Medications  cyclobenzaprine (FLEXERIL) tablet 5 mg (not administered)  ibuprofen (ADVIL,MOTRIN) tablet 400 mg (not administered)     Initial Impression / Assessment and Plan / ED Course  I have reviewed the triage vital signs and the nursing notes.  Pertinent labs & imaging results that were available during my care of the patient were reviewed by me and considered in my medical decision making (see chart for details).  Clinical Course  Value Comment By Time  DG Shoulder Left No obvious fracture or dislocation Lona Kettleshley Laurel Geonna Lockyer, PA-C 08/23 1229    Patient presents to ED with complaint of shoulder pain. Patient is afebrile and non-toxic appearing in NAD. VSS. Physical exam remarkable for mild TTP of left AC joint. ROM, strength, and sensation intact. 2+ radial pulse. Patient X-Ray negative for obvious fracture or dislocation.  Pt advised to follow up with his PCP if symptoms worsen or do not improve. Conservative therapy recommended and discussed. Patient will be discharged home & is agreeable with above plan. Returns precautions discussed. Pt appears safe for discharge.  I personally performed the services described in this documentation, which was scribed in my presence. The recorded information has been reviewed and is accurate.  Final Clinical Impressions(s) / ED Diagnoses   Final diagnoses:  Left shoulder pain    New Prescriptions New  Prescriptions   CYCLOBENZAPRINE (FLEXERIL) 5 MG TABLET    Take 1 tablet (5 mg total) by mouth 3 (three) times daily as needed for muscle spasms.     Lona Kettleshley Laurel Halley Shepheard, [place] JerseyPA-C 12/04/15 1235    Lyndal Pulleyaniel Knott, MD 12/04/15 [phone]

## 2015-12-04 NOTE — ED Notes (Signed)
Pt is in stable condition upon d/c and ambulates from ED. 

## 2016-01-24 ENCOUNTER — Encounter (HOSPITAL_COMMUNITY): Payer: Self-pay

## 2016-01-24 ENCOUNTER — Emergency Department (HOSPITAL_COMMUNITY)
Admission: EM | Admit: 2016-01-24 | Discharge: 2016-01-24 | Disposition: A | Discharge status: Home / Self Care | Payer: BLUE CROSS/BLUE SHIELD | Attending: Emergency Medicine | Admitting: Emergency Medicine

## 2016-01-24 DIAGNOSIS — F17200 Nicotine dependence, unspecified, uncomplicated: Secondary | ICD-10-CM | POA: Diagnosis not present

## 2016-01-24 DIAGNOSIS — R369 Urethral discharge, unspecified: Secondary | ICD-10-CM | POA: Diagnosis not present

## 2016-01-24 DIAGNOSIS — R300 Dysuria: Secondary | ICD-10-CM | POA: Insufficient documentation

## 2016-01-24 LAB — URINALYSIS, ROUTINE W REFLEX MICROSCOPIC
BILIRUBIN URINE: NEGATIVE
GLUCOSE, UA: NEGATIVE mg/dL
Hgb urine dipstick: NEGATIVE
KETONES UR: NEGATIVE mg/dL
NITRITE: NEGATIVE
PROTEIN: NEGATIVE mg/dL
Specific Gravity, Urine: 1.023 (ref 1.005–1.030)
pH: 7 (ref 5.0–8.0)

## 2016-01-24 LAB — GC/CHLAMYDIA PROBE AMP (~~LOC~~) NOT AT ARMC
Chlamydia: NEGATIVE
Neisseria Gonorrhea: POSITIVE — AB

## 2016-01-24 LAB — URINE MICROSCOPIC-ADD ON
RBC / HPF: NONE SEEN RBC/hpf (ref 0–5)
Squamous Epithelial / LPF: NONE SEEN

## 2016-01-24 LAB — HIV ANTIBODY (ROUTINE TESTING W REFLEX): HIV SCREEN 4TH GENERATION: NONREACTIVE

## 2016-01-24 MED ORDER — CEFTRIAXONE SODIUM 250 MG IJ SOLR
250.0000 mg | Freq: Once | INTRAMUSCULAR | Status: AC
  Administered 2016-01-24: 250 mg via INTRAMUSCULAR
  Filled 2016-01-24: qty 250

## 2016-01-24 MED ORDER — AZITHROMYCIN 250 MG PO TABS
1000.0000 mg | ORAL_TABLET | Freq: Once | ORAL | Status: AC
  Administered 2016-01-24: 1000 mg via ORAL
  Filled 2016-01-24: qty 4

## 2016-01-24 MED ORDER — LIDOCAINE HCL (PF) 1 % IJ SOLN
INTRAMUSCULAR | Status: AC
  Administered 2016-01-24: 5 mL
  Filled 2016-01-24: qty 5

## 2016-01-24 NOTE — ED Provider Notes (Signed)
[facility] Provider Note   CSN: [id number] Arrival date & time: 01/24/16  4098     History   Chief Complaint Chief Complaint  Patient presents with  . SEXUALLY TRANSMITTED DISEASE    HPI Donald Hamilton is a 20 y.o. male who presents with dysuria and discharge. PMH significant for hx of Gonorrhea. He states that he had unprotected sex with a new partner. He has had burning after urination and a whitish discharge from the penis for 1 day. Denies fever, chills, abdominal pain, flank pain, N/V, testicular pain. Reports he normally uses condoms but did not this past time.  HPI  History reviewed. No pertinent past medical history.  There are no active problems to display for this patient.   History reviewed. No pertinent surgical history.     Home Medications    Prior to Admission medications   Medication Sig Start Date End Date Taking? Authorizing Provider  cyclobenzaprine (FLEXERIL) 5 MG tablet Take 1 tablet (5 mg total) by mouth 3 (three) times daily as needed for muscle spasms. 12/04/15   Lona Kettle, PA-C  doxycycline (VIBRAMYCIN) 100 MG capsule Take 1 capsule (100 mg total) by mouth 2 (two) times daily. One po bid x 7 days 11/09/13   Fayrene Helper, PA-C  ibuprofen (ADVIL,MOTRIN) 800 MG tablet Take 1 tablet (800 mg total) by mouth 3 (three) times daily. 11/09/13   Fayrene Helper, PA-C    Family History History reviewed. No pertinent family history.  Social History Social History  Substance Use Topics  . Smoking status: Current Every Day Smoker  . Smokeless tobacco: Not on file  . Alcohol use No     Allergies   Review of patient's allergies indicates no known allergies.   Review of Systems Review of Systems  Constitutional: Negative for chills and fever.  Gastrointestinal: Negative for abdominal pain, nausea and vomiting.  Genitourinary: Positive for discharge and dysuria. Negative for flank pain, hematuria, penile pain, penile swelling, scrotal swelling  and testicular pain.     Physical Exam Updated Vital Signs BP 118/67 (BP Location: Right Arm)   Pulse 60   Temp 98.3 F (36.8 C) (Oral)   Resp 18   Ht 6\' 2"  (1.88 m)   Wt 83.9 kg   SpO2 100%   BMI 23.75 kg/m   Physical Exam  Constitutional: He is oriented to person, place, and time. He appears well-developed and well-nourished. No distress.  Calm, cooperative  HENT:  Head: Normocephalic and atraumatic.  Eyes: Conjunctivae are normal. Pupils are equal, round, and reactive to light. Right eye exhibits no discharge. Left eye exhibits no discharge. No scleral icterus.  Neck: Normal range of motion. Neck supple.  Cardiovascular: Normal rate and regular rhythm.   No murmur heard. Pulmonary/Chest: Effort normal and breath sounds normal. No respiratory distress.  Abdominal: Soft. He exhibits no distension. There is no tenderness.  Genitourinary:  Genitourinary Comments: No inguinal lymphadenopathy or inguinal hernia noted. Normal circumcised penis free of lesions or rash. Testicles are nontender with normal lie. Normal scrotal appearance. No obvious discharge noted. Chaperone present during exam.    Musculoskeletal: He exhibits no edema.  Neurological: He is alert and oriented to person, place, and time.  Skin: Skin is warm and dry.  Psychiatric: He has a normal mood and affect.  Nursing note and vitals reviewed.    ED Treatments / Results  Labs (all labs ordered are listed, but only abnormal results are displayed) Labs Reviewed  URINALYSIS, ROUTINE W REFLEX  MICROSCOPIC (NOT AT [facility]) - Abnormal; Notable for the following:       Result Value   Leukocytes, UA SMALL (*)    All other components within normal limits  URINE MICROSCOPIC-ADD ON - Abnormal; Notable for the following:    Bacteria, UA FEW (*)    All other components within normal limits  HIV ANTIBODY (ROUTINE TESTING)  RPR  GC/CHLAMYDIA PROBE AMP ([place]) NOT AT [facility]    EKG  EKG Interpretation None        Radiology No results found.  Procedures Procedures (including critical care time)  Medications Ordered in ED Medications  cefTRIAXone (ROCEPHIN) injection 250 mg (250 mg Intramuscular Given 01/24/16 0836)  azithromycin (ZITHROMAX) tablet 1,000 mg (1,000 mg Oral Given 01/24/16 0836)  lidocaine (PF) (XYLOCAINE) 1 % injection (5 mLs  Given 01/24/16 0837)     Initial Impression / Assessment and Plan / ED Course  I have reviewed the triage vital signs and the nursing notes.  Pertinent labs & imaging results that were available during my care of the patient were reviewed by me and considered in my medical decision making (see chart for details).  Clinical Course   20 year old male presents with symptoms concerning for STD. Patient is afebrile, not tachycardic or tachypneic, normotensive, and not hypoxic. UA has few bacteria with small leukocytes and 6-30 WBC. Exam is unremarkable - no obvious infection however since he has a hx of Gonorrhea, will treat empirically. Encouraged safe sex practices at all times, especially with new partners. Patient is NAD, non-toxic, with stable VS. Patient is informed of clinical course, understands medical decision making process, and agrees with plan. Opportunity for questions provided and all questions answered. Return precautions given.   Final Clinical Impressions(s) / ED Diagnoses   Final diagnoses:  Dysuria  Penile discharge    New Prescriptions New Prescriptions   No medications on file     Bethel BornKelly Marie Lane Eland, PA-C 01/24/16 1217    Lyndal Pulleyaniel Knott, MD 01/24/16 1820

## 2016-01-24 NOTE — ED Triage Notes (Signed)
Pt here for std, pt sts had std in past and unknown what he had but sts now has discharge and burning after urination.

## 2016-01-25 LAB — RPR: RPR: NONREACTIVE

## 2016-01-27 ENCOUNTER — Telehealth (HOSPITAL_BASED_OUTPATIENT_CLINIC_OR_DEPARTMENT_OTHER): Payer: Self-pay | Admitting: Emergency Medicine

## 2016-06-27 ENCOUNTER — Encounter (HOSPITAL_COMMUNITY): Payer: Self-pay | Admitting: Nurse Practitioner

## 2016-06-27 ENCOUNTER — Emergency Department (HOSPITAL_COMMUNITY)
Admission: EM | Admit: 2016-06-27 | Discharge: 2016-06-27 | Disposition: A | Discharge status: Home / Self Care | Payer: BLUE CROSS/BLUE SHIELD | Attending: Emergency Medicine | Admitting: Emergency Medicine

## 2016-06-27 DIAGNOSIS — F17200 Nicotine dependence, unspecified, uncomplicated: Secondary | ICD-10-CM | POA: Diagnosis not present

## 2016-06-27 DIAGNOSIS — R300 Dysuria: Secondary | ICD-10-CM | POA: Diagnosis not present

## 2016-06-27 LAB — URINALYSIS, ROUTINE W REFLEX MICROSCOPIC
Bacteria, UA: NONE SEEN
Bilirubin Urine: NEGATIVE
Glucose, UA: NEGATIVE mg/dL
HGB URINE DIPSTICK: NEGATIVE
Ketones, ur: NEGATIVE mg/dL
NITRITE: NEGATIVE
PH: 5 (ref 5.0–8.0)
Protein, ur: NEGATIVE mg/dL
SQUAMOUS EPITHELIAL / LPF: NONE SEEN
Specific Gravity, Urine: 1.032 — ABNORMAL HIGH (ref 1.005–1.030)

## 2016-06-27 MED ORDER — STERILE WATER FOR INJECTION IJ SOLN
INTRAMUSCULAR | Status: AC
  Administered 2016-06-27: 10 mL
  Filled 2016-06-27: qty 10

## 2016-06-27 MED ORDER — CEFTRIAXONE SODIUM 250 MG IJ SOLR
250.0000 mg | Freq: Once | INTRAMUSCULAR | Status: AC
  Administered 2016-06-27: 250 mg via INTRAMUSCULAR
  Filled 2016-06-27: qty 250

## 2016-06-27 MED ORDER — AZITHROMYCIN 250 MG PO TABS
1000.0000 mg | ORAL_TABLET | Freq: Once | ORAL | Status: AC
  Administered 2016-06-27: 1000 mg via ORAL
  Filled 2016-06-27: qty 4

## 2016-06-27 NOTE — Discharge Instructions (Signed)
Treatment: You have already been treated for gonorrhea and chlamydia in the ED. Please follow-up with the health Department if you test positive for HIV or syphilis. Please tell all of your sexual partners of any positive results in that they will need to be treated as well.  Follow-up: You can follow-up with the health department as outlined below in the future for free STD checks. Please return to the emergency department if you develop any new or worsening symptoms.

## 2016-06-27 NOTE — ED Triage Notes (Signed)
Pt presents with c/o dysuria. The dysuria began yesterday. He denies fevers, nausea, vomiting, abdominal pain, penile discharge. He is currently sexually active, reports regular condom use.

## 2016-06-27 NOTE — ED Provider Notes (Signed)
[facility] Provider Note   CSN: [id number] Arrival date & time: 06/27/16  1738    By signing my name below, I, Valentino SaxonBianca Contreras, attest that this documentation has been prepared under the direction and in the presence of Buel ReamAlexandra Javaun Dimperio, PA-C. Electronically Signed: Valentino SaxonBianca Contreras, ED Scribe. 06/27/16. 6:17 PM.  History   Chief Complaint Chief Complaint  Patient presents with  . Dysuria   The history is provided by the patient. No language interpreter was used.   HPI Comments: Donald Hamilton is a 21 y.o. male with no pertinent PMHx who presents to the Emergency Department complaining of moderate, constant, dysuria onset yesterday. Pt notes he is currently sexually active and reports using condoms. No modifying factors or OTC medications noted. Pt denies penile discharge, blood in urine, penile pain, penile swelling, testicular pain, abdominal pain, fever, nausea and vomiting.   History reviewed. No pertinent past medical history.  There are no active problems to display for this patient.   History reviewed. No pertinent surgical history.     Home Medications    Prior to Admission medications   Medication Sig Start Date End Date Taking? Authorizing Provider  cyclobenzaprine (FLEXERIL) 5 MG tablet Take 1 tablet (5 mg total) by mouth 3 (three) times daily as needed for muscle spasms. 12/04/15   Lona KettleAshley Laurel Meyer, PA-C  doxycycline (VIBRAMYCIN) 100 MG capsule Take 1 capsule (100 mg total) by mouth 2 (two) times daily. One po bid x 7 days 11/09/13   Fayrene HelperBowie Tran, PA-C  ibuprofen (ADVIL,MOTRIN) 800 MG tablet Take 1 tablet (800 mg total) by mouth 3 (three) times daily. 11/09/13   Fayrene HelperBowie Tran, PA-C    Family History History reviewed. No pertinent family history.  Social History Social History  Substance Use Topics  . Smoking status: Current Every Day Smoker  . Smokeless tobacco: Never Used  . Alcohol use Yes     Comment: social     Allergies   Patient has no known  allergies.   Review of Systems Review of Systems  Constitutional: Negative for chills and fever.  HENT: Negative for facial swelling and sore throat.   Respiratory: Negative for shortness of breath.   Cardiovascular: Negative for chest pain.  Gastrointestinal: Negative for abdominal pain, nausea and vomiting.  Genitourinary: Positive for dysuria. Negative for discharge, hematuria, penile pain, penile swelling and testicular pain.  Musculoskeletal: Negative for back pain.  Skin: Negative for rash and wound.  Neurological: Negative for headaches.  Psychiatric/Behavioral: The patient is not nervous/anxious.      Physical Exam Updated Vital Signs BP 128/64 (BP Location: Right Arm)   Pulse 71   Temp 98.4 F (36.9 C) (Oral)   Resp 20   SpO2 100%   Physical Exam  Constitutional: He appears well-developed and well-nourished. No distress.  HENT:  Head: Normocephalic and atraumatic.  Mouth/Throat: Oropharynx is clear and moist. No oropharyngeal exudate.  Eyes: Conjunctivae are normal. Pupils are equal, round, and reactive to light. Right eye exhibits no discharge. Left eye exhibits no discharge. No scleral icterus.  Neck: Normal range of motion. Neck supple. No thyromegaly present.  Cardiovascular: Normal rate, regular rhythm, normal heart sounds and intact distal pulses.  Exam reveals no gallop and no friction rub.   No murmur heard. Pulmonary/Chest: Effort normal and breath sounds normal. No stridor. No respiratory distress. He has no wheezes. He has no rales.  Abdominal: Soft. Bowel sounds are normal. He exhibits no distension. There is no tenderness. There is no rebound  and no guarding.  Musculoskeletal: He exhibits no edema.  Lymphadenopathy:    He has no cervical adenopathy.  Neurological: He is alert. Coordination normal.  Skin: Skin is warm and dry. No rash noted. He is not diaphoretic. No pallor.  Psychiatric: He has a normal mood and affect.  Nursing note and vitals  reviewed.    ED Treatments / Results   DIAGNOSTIC STUDIES: Oxygen Saturation is 100% on RA, normal by my interpretation.    COORDINATION OF CARE: 6:15 PM Discussed treatment plan with pt at bedside which includes labs and pt agreed to plan.   Labs (all labs ordered are listed, but only abnormal results are displayed) Labs Reviewed  URINALYSIS, ROUTINE W REFLEX MICROSCOPIC - Abnormal; Notable for the following:       Result Value   Specific Gravity, Urine 1.032 (*)    Leukocytes, UA TRACE (*)    All other components within normal limits  RPR  HIV ANTIBODY (ROUTINE TESTING)  GC/CHLAMYDIA PROBE AMP ([place]) NOT AT [facility]    EKG  EKG Interpretation None       Radiology No results found.  Procedures Procedures (including critical care time)  Medications Ordered in ED Medications  cefTRIAXone (ROCEPHIN) injection 250 mg (not administered)  azithromycin (ZITHROMAX) tablet 1,000 mg (not administered)     Initial Impression / Assessment and Plan / ED Course  I have reviewed the triage vital signs and the nursing notes.  Pertinent labs & imaging results that were available during my care of the patient were reviewed by me and considered in my medical decision making (see chart for details).     Patient with dysuria. UA shows trace hematuria, 6-30 WBCs, mucus present. Patient treated in the ED for STI with Rocephin and azithromycin. Patient advised to inform and treat all sexual partners.  Pt advised on safe sex practices and understands that they have GC/Chlamydia urine cultures pending and will result in 2-3 days. HIV and RPR sent. Patient refused penile swab. Pt encouraged to follow up at local health department for future STI checks. No concern for prostatitis or epididymitis. Discussed return precautions. Pt appears safe for discharge.    Final Clinical Impressions(s) / ED Diagnoses   Final diagnoses:  Dysuria    New Prescriptions New Prescriptions   No  medications on file    I personally performed the services described in this documentation, which was scribed in my presence. The recorded information has been reviewed and is accurate.     Emi Holes, PA-C 06/27/16 1937    Melene Plan, DO 06/27/16 2135

## 2016-06-28 LAB — HIV ANTIBODY (ROUTINE TESTING W REFLEX): HIV Screen 4th Generation wRfx: NONREACTIVE

## 2016-06-28 LAB — RPR: RPR Ser Ql: NONREACTIVE

## 2016-06-29 LAB — GC/CHLAMYDIA PROBE AMP (~~LOC~~) NOT AT ARMC
Chlamydia: POSITIVE — AB
Neisseria Gonorrhea: NEGATIVE

## 2016-09-01 ENCOUNTER — Emergency Department (HOSPITAL_COMMUNITY)
Admission: EM | Admit: 2016-09-01 | Discharge: 2016-09-01 | Disposition: A | Discharge status: Home / Self Care | Payer: BLUE CROSS/BLUE SHIELD | Attending: Emergency Medicine | Admitting: Emergency Medicine

## 2016-09-01 ENCOUNTER — Emergency Department (HOSPITAL_COMMUNITY): Payer: BLUE CROSS/BLUE SHIELD

## 2016-09-01 ENCOUNTER — Encounter (HOSPITAL_COMMUNITY): Payer: Self-pay | Admitting: Emergency Medicine

## 2016-09-01 DIAGNOSIS — F17200 Nicotine dependence, unspecified, uncomplicated: Secondary | ICD-10-CM | POA: Insufficient documentation

## 2016-09-01 DIAGNOSIS — Z79899 Other long term (current) drug therapy: Secondary | ICD-10-CM | POA: Insufficient documentation

## 2016-09-01 DIAGNOSIS — R002 Palpitations: Secondary | ICD-10-CM | POA: Diagnosis present

## 2016-09-01 LAB — URINALYSIS, ROUTINE W REFLEX MICROSCOPIC
BILIRUBIN URINE: NEGATIVE
GLUCOSE, UA: NEGATIVE mg/dL
HGB URINE DIPSTICK: NEGATIVE
Ketones, ur: NEGATIVE mg/dL
Leukocytes, UA: NEGATIVE
Nitrite: NEGATIVE
PROTEIN: NEGATIVE mg/dL
Specific Gravity, Urine: 1.019 (ref 1.005–1.030)
pH: 6 (ref 5.0–8.0)

## 2016-09-01 LAB — RAPID URINE DRUG SCREEN, HOSP PERFORMED
AMPHETAMINES: NOT DETECTED
BENZODIAZEPINES: NOT DETECTED
Barbiturates: NOT DETECTED
COCAINE: NOT DETECTED
Opiates: NOT DETECTED
TETRAHYDROCANNABINOL: POSITIVE — AB

## 2016-09-01 LAB — BASIC METABOLIC PANEL
ANION GAP: 8 (ref 5–15)
BUN: 8 mg/dL (ref 6–20)
CHLORIDE: 107 mmol/L (ref 101–111)
CO2: 26 mmol/L (ref 22–32)
Calcium: 9.4 mg/dL (ref 8.9–10.3)
Creatinine, Ser: 1.02 mg/dL (ref 0.61–1.24)
GFR calc non Af Amer: >60 mL/min (ref >60)
GLUCOSE: 90 mg/dL (ref 65–99)
POTASSIUM: 4 mmol/L (ref 3.5–5.1)
Sodium: 141 mmol/L (ref 135–145)

## 2016-09-01 LAB — CBC
HEMATOCRIT: 44.1 % (ref 39.0–52.0)
HEMOGLOBIN: 14.4 g/dL (ref 13.0–17.0)
MCH: 30.4 pg (ref 26.0–34.0)
MCHC: 32.7 g/dL (ref 30.0–36.0)
MCV: 93.2 fL (ref 78.0–100.0)
Platelets: 190 10*3/uL (ref 150–400)
RBC: 4.73 MIL/uL (ref 4.22–5.81)
RDW: 12.1 % (ref 11.5–15.5)
WBC: 5.3 10*3/uL (ref 4.0–10.5)

## 2016-09-01 LAB — I-STAT TROPONIN, ED: Troponin i, poc: 0 ng/mL (ref 0.00–0.08)

## 2016-09-01 LAB — TSH: TSH: 2.057 u[IU]/mL (ref 0.350–4.500)

## 2016-09-01 LAB — MAGNESIUM: Magnesium: 2.1 mg/dL (ref 1.7–2.4)

## 2016-09-01 NOTE — ED Provider Notes (Signed)
[facility]-EMERGENCY DEPT Provider Note   CSN: [id number] Arrival date & time: 09/01/16  1218     History   Chief Complaint Chief Complaint  Patient presents with  . Palpitations    HPI Donald Hamilton is a 21 y.o. male who presents with 1 week of intermittent palpitations and shortness of breath. Patient states that over the last week she has experienced several episodes of a feeling feeling like "his heart is skipping." He states that when he gets these episodes he also feels shortness of breath but denies experiencing it at any other time. He denies any chest pain associated with these episodes. Patient states that he was sitting at home when he first had this episode of symptoms. He states that episodes are random and are not tied to specific activity or exertion. He reports that he has had multiple episodes a day for the past week. He does note that he has them after he smokes marijuana. He reports that he last smoked marijuana last night. Patient reports that he does drink several sodas a day but denies any coffee or energy drink use. He he smokes one to 2 black mild today. He denies any cigarette use, cocaine use, heroine use. He denies any recent sickness, fever, nausea/vomiting, constipation, heat/cold intolerance, leg swelling. He denies any recent surgery, immobilization, travel, history of blood clots in his legs or lungs.  The history is provided by the patient.    History reviewed. No pertinent past medical history.  There are no active problems to display for this patient.   History reviewed. No pertinent surgical history.     Home Medications    Prior to Admission medications   Medication Sig Start Date End Date Taking? Authorizing Provider  cyclobenzaprine (FLEXERIL) 5 MG tablet Take 1 tablet (5 mg total) by mouth 3 (three) times daily as needed for muscle spasms. 12/04/15   Deborha Payment, PA-C  doxycycline (VIBRAMYCIN) 100 MG capsule Take 1 capsule (100 mg total) by  mouth 2 (two) times daily. One po bid x 7 days 11/09/13   Fayrene Helper, PA-C  ibuprofen (ADVIL,MOTRIN) 800 MG tablet Take 1 tablet (800 mg total) by mouth 3 (three) times daily. 11/09/13   Fayrene Helper, PA-C    Family History History reviewed. No pertinent family history.  Social History Social History  Substance Use Topics  . Smoking status: Current Every Day Smoker  . Smokeless tobacco: Never Used  . Alcohol use Yes     Comment: social     Allergies   Patient has no known allergies.   Review of Systems Review of Systems  Constitutional: Negative for fever.  Respiratory: Positive for shortness of breath.   Cardiovascular: Positive for palpitations. Negative for chest pain and leg swelling.  Gastrointestinal: Negative for abdominal pain, nausea and vomiting.     Physical Exam Updated Vital Signs BP 121/70 (BP Location: Right Arm)   Pulse (!) 47   Temp 98 F (36.7 C) (Oral)   Resp 16   SpO2 100%   Physical Exam  Constitutional: He is oriented to person, place, and time. He appears well-developed and well-nourished.  Sitting comfortably on examination table  HENT:  Head: Normocephalic and atraumatic.  Mouth/Throat: Oropharynx is clear and moist and mucous membranes are normal.  Eyes: Conjunctivae, EOM and lids are normal. Pupils are equal, round, and reactive to light.  Neck: Full passive range of motion without pain.  Cardiovascular: Normal rate, regular rhythm, normal heart sounds and normal pulses.  Exam reveals no gallop, no S3, no S4 and no friction rub.   No murmur heard. No edema to bilateral lower extremities  Pulmonary/Chest: Effort normal and breath sounds normal.  No evidence of respiratory distress. Able to speak in full sentences without difficulty.  Abdominal: Soft. Normal appearance. There is no tenderness. There is no rigidity and no guarding.  Musculoskeletal: Normal range of motion.  Neurological: He is alert and oriented to person, place, and time.    Skin: Skin is warm and dry. Capillary refill takes less than 2 seconds.  Psychiatric: He has a normal mood and affect. His speech is normal.  Nursing note and vitals reviewed.    ED Treatments / Results  Labs (all labs ordered are listed, but only abnormal results are displayed) Labs Reviewed  RAPID URINE DRUG SCREEN, HOSP PERFORMED - Abnormal; Notable for the following:       Result Value   Tetrahydrocannabinol POSITIVE (*)    All other components within normal limits  BASIC METABOLIC PANEL  CBC  URINALYSIS, ROUTINE W REFLEX MICROSCOPIC  TSH  MAGNESIUM  I-STAT TROPOININ, ED    EKG  EKG Interpretation  Date/Time:  Tuesday Sep 01 2016 12:23:53 EDT Ventricular Rate:  68 PR Interval:  136 QRS Duration: 92 QT Interval:  394 QTC Calculation: 418 R Axis:   68 Text Interpretation:  Normal sinus rhythm with sinus arrhythmia Normal ECG isolated T wave inversion in III, no previous EKG available Confirmed by Frederick PeersLittle, Rachel [phone]([id number]) on 09/01/2016 12:28:20 PM Also confirmed by Frederick PeersLittle, Rachel [phone]), editor Misty StanleyScales-Price, Shannon [phone]([id number])  on 09/01/2016 12:34:33 PM       Radiology Dg Chest 2 View  Result Date: 09/01/2016 CLINICAL DATA:  21 y/o M; palpitations and shortness of breath. Chest pain. EXAM: CHEST  2 VIEW COMPARISON:  04/16/2015 chest radiograph FINDINGS: Stable heart size and mediastinal contours are within normal limits. Both lungs are clear. The visualized skeletal structures are unremarkable. IMPRESSION: No active cardiopulmonary disease. Electronically Signed   By: Mitzi HansenLance  Furusawa-Stratton M.D.   On: 09/01/2016 14:09    Procedures Procedures (including critical care time)  Medications Ordered in ED Medications - No data to display   Initial Impression / Assessment and Plan / ED Course  I have reviewed the triage vital signs and the nursing notes.  Pertinent labs & imaging results that were available during my care of the patient were reviewed by me and considered  in my medical decision making (see chart for details).     21 year old male who presents accompanied with intermittent shortness of breath and chest palpitations. Patient is afebrile, non-toxic appearing, sitting comfortably on examination table. Consider arrhythmia versus caffeine use versus anxiety versus drug use. History/physical exam are not concerning for a PE. Per negative PERC criteria, patient is at low risk for an acute PE. Labs and imaging ordered at triage, including CBC, BMP, TSH, UA, UDS, EKG, chest x-ray.   Labs and imaging reviewed. BMP within normal limits no acute abnormalities. CBC with stable H&H. Initial troponin is negative. Chest x-ray is negative for any acute process. EKG reviewed. NSR with sinus arrhythmia, rate 68. Isolated inverted T waves in lead 3. Urine and TSH pending. Will place patient on cardiac monitor to monitor for any acute abnormalities and rhythm.  6:33 PM: Monitor shows patient with Sinus Huston FoleyBrady and PACs. Unable to be captured on the monitor. Will repeat EKG. Will additionally add magnesium to labs orders to evaluate for hypomagnesia.   Magnesium resulted. Urinalysis shows  no signs of infection. TSH within normal limits. Rapid urine drug screen shows positive for marijuana but patient endorses smoking marijuana yesterday. Repeat EKG is sinus bradycardia rate 49. No PACs noted.  Re-evaluation: Patient reports feeling improved. He has not had an episode of palpitations for the last hour or so. Cardiac monitor reviewed. Heart rate is in the 60s. Discussed results with patient. Explained to patient at this time that he is stable for discharge. Explained that he'll need to follow up with an outpatient cardiologist referral for further evaluation and possible Holter monitoring. Instructed patient limit his caffeine intake. Patient provided with outpatient cardiology referral and instructed to follow up within the next 24-48 hours. Strict return precautions discussed.  Patient's breasts understanding and agreement to plan.    Final Clinical Impressions(s) / ED Diagnoses   Final diagnoses:  Palpitations    New Prescriptions New Prescriptions   No medications on file     Rosana Hoes 09/01/16 2104    Tegeler, Canary Brim, MD 09/02/16 [phone]

## 2016-09-01 NOTE — Discharge Instructions (Signed)
Follow-up with referred cardiologist provided. Call them and arrange for an appointment in the next 24-48 hours.  Follow-up with her primary care doctor as needed. If you do not have a primary care doctor you can use one provided in the list below.  Limit the amount of caffeine that you drink. Avoid smoking marijuana while you reading to see the cardiology doctor.  Return the emergency Department for any worsening palpitations, chest pain, difficulty breathing, nausea/vomiting, or any other worsening or concerning symptoms.   If you do not have a primary care doctor you see regularly, please you the list below. Please call them to arrange for follow-up.    No Primary Care Doctor Call [facility]  [phone] Other agencies that provide inexpensive medical care    [facility]  [phone]    [facility]  [phone]    [facility]  [phone]    [facility]  [phone]    [facility]  [phone]    [facility]  [phone]

## 2016-09-01 NOTE — ED Triage Notes (Signed)
Pt sts palpitations with SOB intermittently x 1 week

## 2016-09-01 NOTE — ED Notes (Signed)
Lab contacted to add on magnesium. 

## 2016-09-01 NOTE — ED Notes (Addendum)
Pt left in [employer] custody. As RN attempted to collexct last set of vital pt snatched his wirse off.

## 2017-09-23 IMAGING — DX DG CHEST 2V
2 series · 2 of 2 positions shown · non-contrast
Comparison: 04/16/2015 chest radiograph

CLINICAL DATA: 20 y/o M; palpitations and shortness of breath.
Chest pain.

EXAM:
CHEST  2 VIEW

[chest pa]
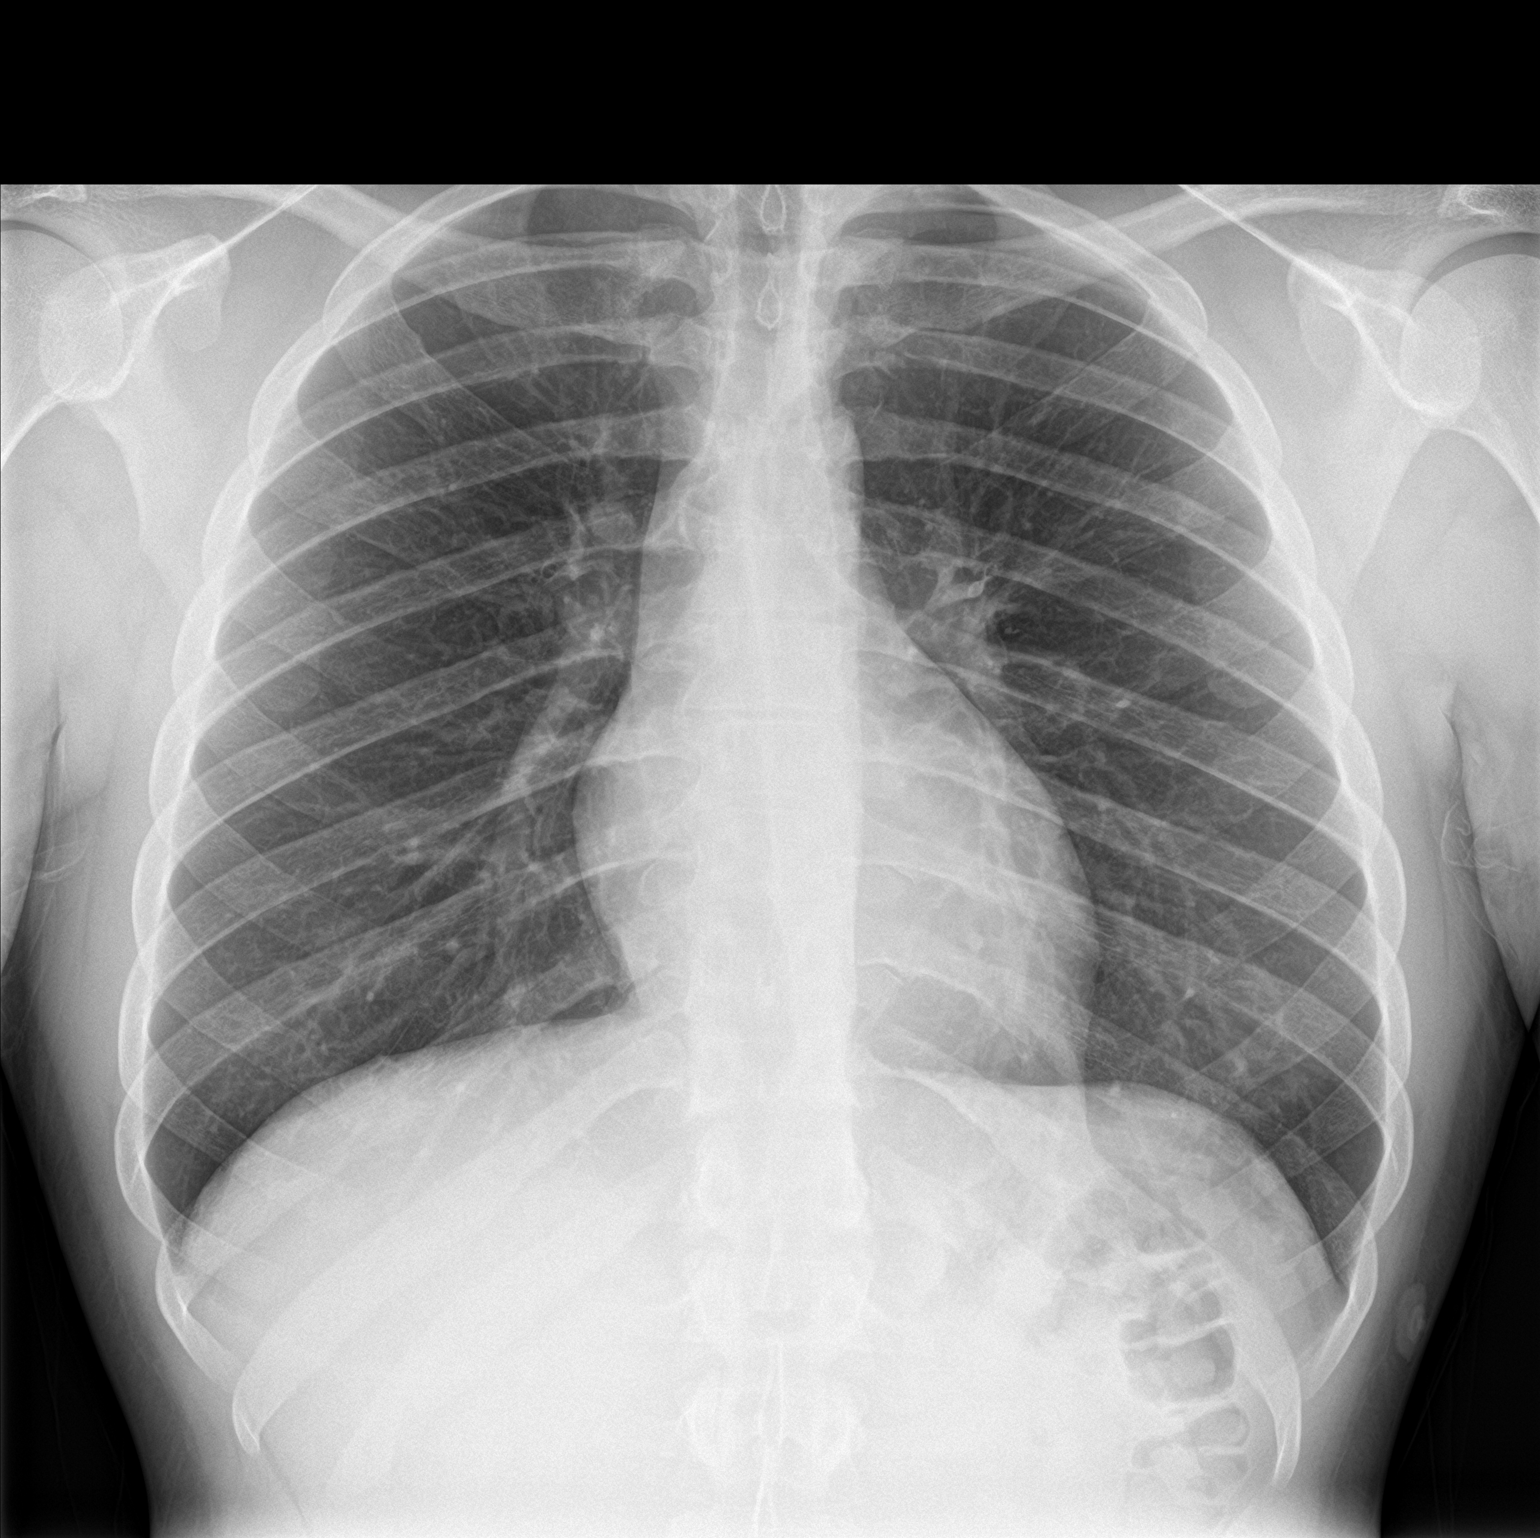

[chest lat]
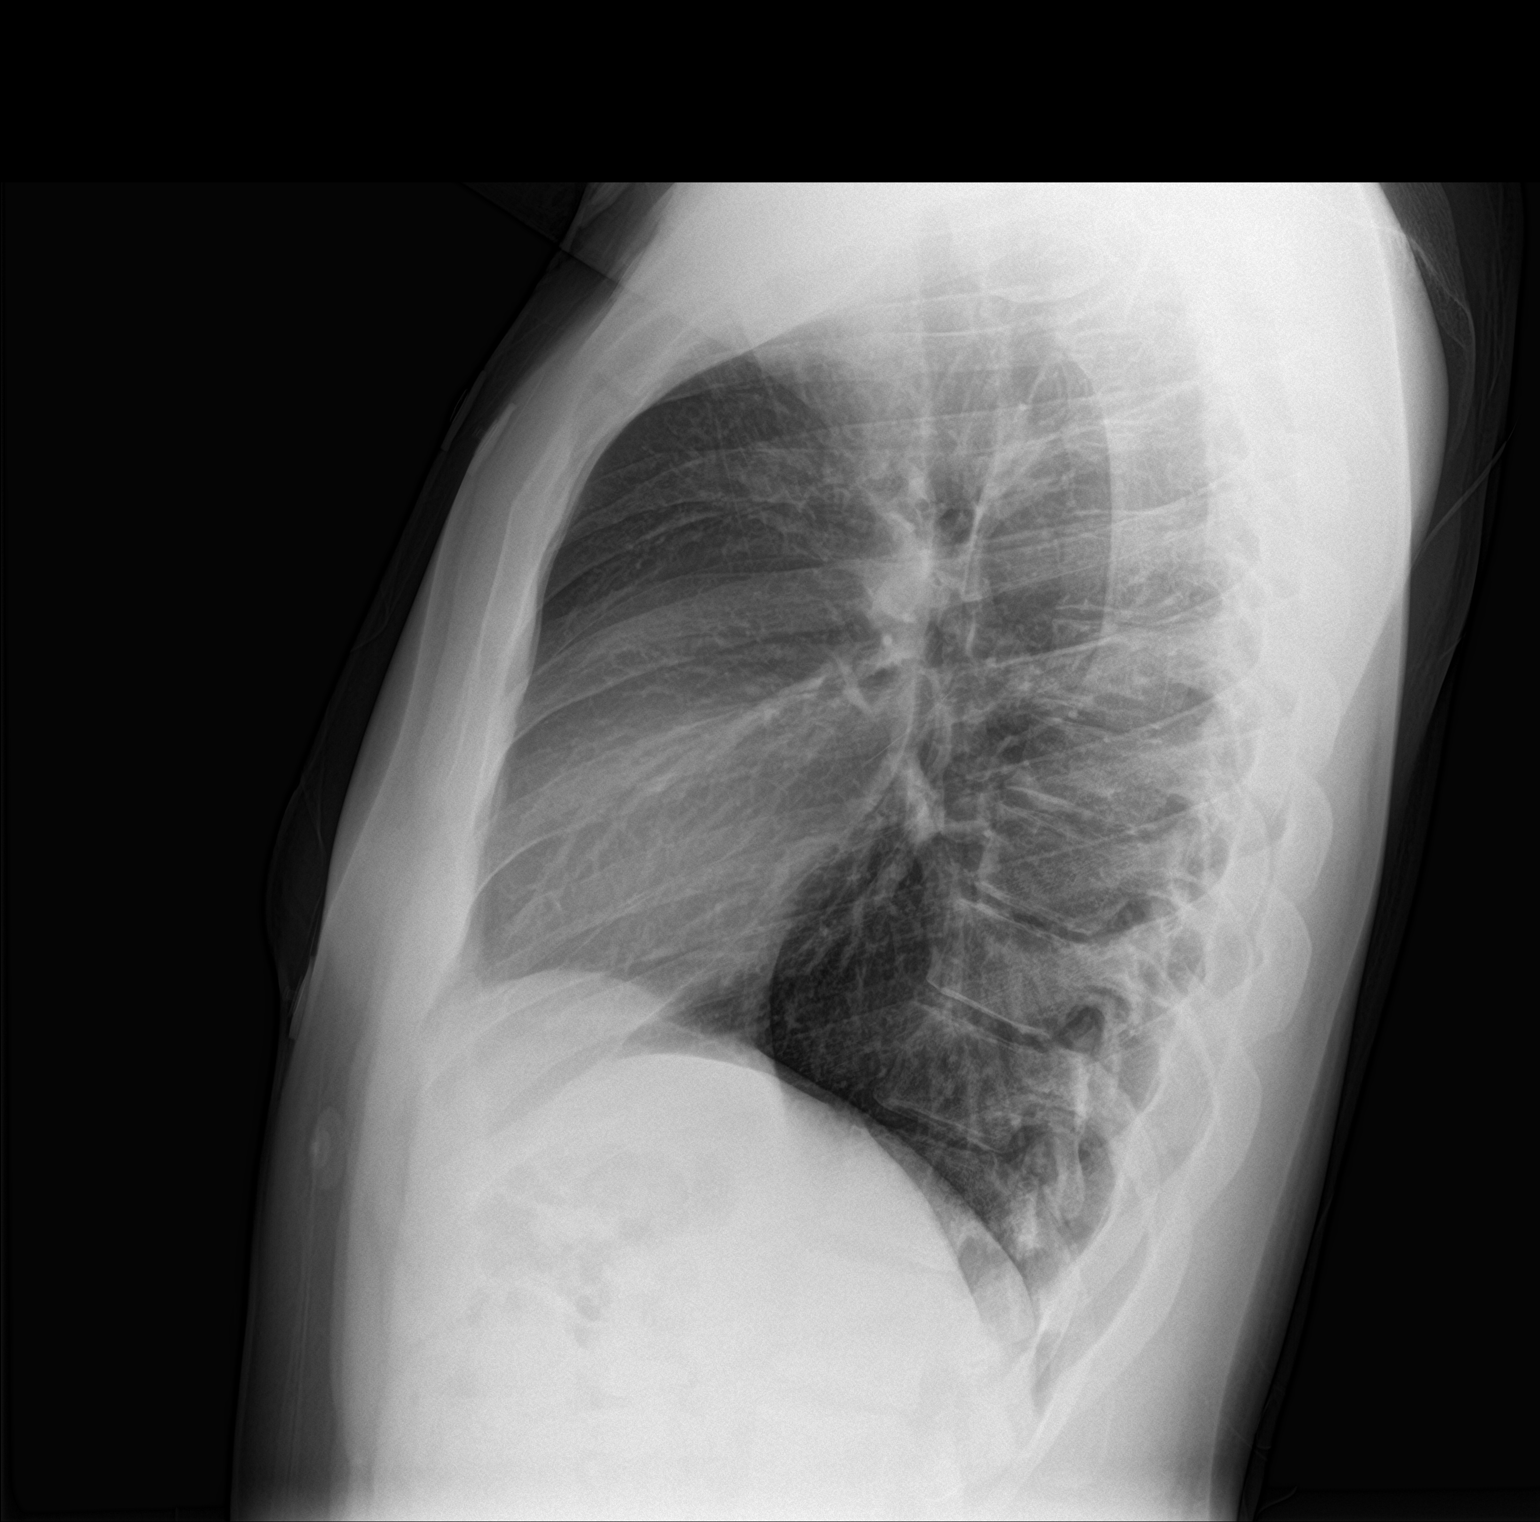

[2 of 2 positions shown; findings below may reference images not displayed]

FINDINGS: Stable heart size and mediastinal contours are within normal limits.
Both lungs are clear. The visualized skeletal structures are
unremarkable.
IMPRESSION: No active cardiopulmonary disease.

By: Benrabah Etoil M.D.

## 2019-06-16 ENCOUNTER — Other Ambulatory Visit: Payer: Self-pay

## 2019-06-16 ENCOUNTER — Emergency Department (HOSPITAL_COMMUNITY)
Admission: EM | Admit: 2019-06-16 | Discharge: 2019-06-16 | Disposition: A | Discharge status: Home / Self Care | Payer: Managed Care, Other (non HMO) | Attending: Emergency Medicine | Admitting: Emergency Medicine

## 2019-06-16 ENCOUNTER — Encounter (HOSPITAL_COMMUNITY): Payer: Self-pay | Admitting: Emergency Medicine

## 2019-06-16 DIAGNOSIS — H5712 Ocular pain, left eye: Secondary | ICD-10-CM | POA: Insufficient documentation

## 2019-06-16 DIAGNOSIS — Z5321 Procedure and treatment not carried out due to patient leaving prior to being seen by health care provider: Secondary | ICD-10-CM | POA: Insufficient documentation

## 2019-06-16 DIAGNOSIS — H538 Other visual disturbances: Secondary | ICD-10-CM | POA: Diagnosis not present

## 2019-06-16 NOTE — ED Notes (Signed)
I do not see pt in lobby and I have called pt X3 no answer

## 2019-06-16 NOTE — ED Triage Notes (Signed)
Pt endorses eye redness and pain for 3 days to left eye. Blurred vision in that eye.

## 2019-07-19 ENCOUNTER — Emergency Department (HOSPITAL_COMMUNITY)
Admission: EM | Admit: 2019-07-19 | Discharge: 2019-07-19 | Disposition: A | Discharge status: Home / Self Care | Payer: Managed Care, Other (non HMO) | Attending: Emergency Medicine | Admitting: Emergency Medicine

## 2019-07-19 ENCOUNTER — Other Ambulatory Visit: Payer: Self-pay

## 2019-07-19 ENCOUNTER — Encounter (HOSPITAL_COMMUNITY): Payer: Self-pay | Admitting: Emergency Medicine

## 2019-07-19 DIAGNOSIS — H53149 Visual discomfort, unspecified: Secondary | ICD-10-CM | POA: Insufficient documentation

## 2019-07-19 DIAGNOSIS — F17210 Nicotine dependence, cigarettes, uncomplicated: Secondary | ICD-10-CM | POA: Insufficient documentation

## 2019-07-19 DIAGNOSIS — H5713 Ocular pain, bilateral: Secondary | ICD-10-CM

## 2019-07-19 NOTE — ED Triage Notes (Signed)
Pt c/o left eye pain and loss of vision, pt denies any injury no swollen or redness noticed. Pt states symptoms is been there for almost a month.

## 2019-07-19 NOTE — ED Notes (Signed)
Unable to perform visual acuity test, pt states he wears glasses and did not bring them

## 2019-07-19 NOTE — ED Provider Notes (Signed)
[facility] EMERGENCY DEPARTMENT Provider Note   CSN: [id number] Arrival date & time: 07/19/19  6283     History Chief Complaint  Patient presents with  . Eye Pain    Donald Hamilton is a 24 y.o. male with past medical history significant for anterior uveitis who presents to the ED for worsening eye discomfort.  Patient reports that he is post to have a follow-up with his ophthalmologist, Dr. Sherryll Burger, at some point this month but is unaware as to whether or not he may have already missed it.  Patient had been treated with prednisone taper, methotrexate, folic acid, prednisone ophthalmic drops, atropine, and dorzolamide-timolol with good effect, but states that he has been off of his medications for a while.  Dr. Sherryll Burger was contemplating Humira.  Patient received extensive work-up including RPR, MHA-TP, HIV, ANA, ANCA, quinacrine gold, hepatitis panel, MPO, PR-3, lysozyme, ACE, toxoplasmosis, and chest x-ray which were all negative.    HPI     History reviewed. No pertinent past medical history.  There are no problems to display for this patient.   History reviewed. No pertinent surgical history.     No family history on file.  Social History   Tobacco Use  . Smoking status: Current Every Day Smoker  . Smokeless tobacco: Never Used  Substance Use Topics  . Alcohol use: Yes    Comment: social  . Drug use: No    Home Medications Prior to Admission medications   Medication Sig Start Date End Date Taking? Authorizing Provider  prednisoLONE acetate (PRED FORTE) 1 % ophthalmic suspension Place 1 drop into both eyes in the morning and at bedtime. 03/29/19  Yes [provider]  cyclobenzaprine (FLEXERIL) 5 MG tablet Take 1 tablet (5 mg total) by mouth 3 (three) times daily as needed for muscle spasms. Patient not taking: Reported on 07/19/2019 12/04/15   Deborha Payment, PA-C  doxycycline (VIBRAMYCIN) 100 MG capsule Take 1 capsule (100 mg total) by mouth 2  (two) times daily. One po bid x 7 days Patient not taking: Reported on 07/19/2019 11/09/13   Fayrene Helper, PA-C  ibuprofen (ADVIL,MOTRIN) 800 MG tablet Take 1 tablet (800 mg total) by mouth 3 (three) times daily. Patient not taking: Reported on 07/19/2019 11/09/13   Fayrene Helper, PA-C    Allergies    Patient has no known allergies.  Review of Systems   Review of Systems  Eyes: Positive for photophobia and pain. Negative for discharge, redness and itching.    Physical Exam Updated Vital Signs BP 138/77 (BP Location: Right Arm)   Pulse 69   Temp 98.5 F (36.9 C) (Oral)   Resp 16   Ht 6\' 2"  (1.88 m)   Wt 99.8 kg   SpO2 98%   BMI 28.25 kg/m   Physical Exam Vitals and nursing note reviewed. Exam conducted with a chaperone present.  Constitutional:      General: He is not in acute distress.    Appearance: Normal appearance. He is not ill-appearing.  HENT:     Head: Normocephalic and atraumatic.  Eyes:     Comments: Eyes: No significant erythema.  EOM intact.  No pain with EOM.  No discharge.  No masses or swelling appreciated.  No scleral icterus.  PERRLA.  No nystagmus.  Consensual photophobia.  Cardiovascular:     Rate and Rhythm: Normal rate and regular rhythm.     Pulses: Normal pulses.     Heart sounds: Normal heart sounds.  Pulmonary:     Effort: Pulmonary effort is normal. No respiratory distress.     Breath sounds: Normal breath sounds.  Musculoskeletal:     Cervical back: Normal range of motion.  Skin:    General: Skin is dry.  Neurological:     Mental Status: He is alert and oriented to person, place, and time.     GCS: GCS eye subscore is 4. GCS verbal subscore is 5. GCS motor subscore is 6.  Psychiatric:        Mood and Affect: Mood normal.        Behavior: Behavior normal.        Thought Content: Thought content normal.      ED Results / Procedures / Treatments   Labs (all labs ordered are listed, but only abnormal results are displayed) Labs Reviewed - No  data to display  EKG None  Radiology No results found.  Procedures Procedures (including critical care time)  Medications Ordered in ED Medications - No data to display  ED Course  I have reviewed the triage vital signs and the nursing notes.  Pertinent labs & imaging results that were available during my care of the patient were reviewed by me and considered in my medical decision making (see chart for details).    MDM Rules/Calculators/A&P                      Unable to perform visual acuity test here in the ED as patient states that he did not bring his corrective lenses.  Patient states that his eye discomfort feels similar to what he was experiencing a few months ago that led to his diagnosis of uveitis.  He denies any recent changes or precipitating trauma.  He does feel as though his left eye is a bit "cloudy" but states that his visual acuity is otherwise intact.  His pain was worse this morning and has spontaneously improved, however he knows that he needs to follow-up with Dr. Dwana Melena with outpatient ophthalmology.  I called the [facility] and evidently he had an appointment scheduled yesterday that he no-showed.  They have attempted to schedule follow-up appointments dating back to his last office visit 03/28/2019 which he has not attended.  I consulted with the office to determine whether or not patient should be provided topical prednisolone 1% drops and/or atropine.  I spoke with Dr. Ouida Sills who wants the patient to be seen at an urgent care clinic this afternoon or first and tomorrow morning.  He provided a phone number for patient to call about scheduling one.  He would prefer that we hold off on drops or any other intervention until he is evaluated at the eye center.  I discussed this with the patient and he will call immediately and go there soon as possible.  Given his reported "cloudy" vision, strictly advising him to avoid operating any motor vehicles.  Strict  ED return precautions discussed.  Patient voices understanding and is agreeable to the plan.     Final Clinical Impression(s) / ED Diagnoses Final diagnoses:  Pain of both eyes    Rx / DC Orders ED Discharge Orders    None       Corena Herter, PA-C 07/19/19 1023    Tegeler, Gwenyth Allegra, MD 07/19/19 [phone]

## 2019-07-19 NOTE — Discharge Instructions (Addendum)
You need to go to the [facility] in [place].  The phone number is [phone] to schedule your appointment.  Please call them as soon as you leave the ED.    You will need to be evaluated in the next 24 hours.  I spoke with Dr. Charleston Ropes who advised you to come to the urgent care for evaluation and management.  Given your reported cloudy vision, I strictly advise you to avoid operating any motor vehicles.  Return to the ED or seek immediate medical attention should you experience any new or worsening symptoms.
